# Patient Record
Sex: Female | Born: 1970 | Race: Black or African American | Hispanic: No | Smoking: Never smoker
Health system: Southern US, Community
[De-identification: ages and names within clinical notes are randomized; demographics above are authoritative.]

## PROBLEM LIST (undated history)

## (undated) DIAGNOSIS — I10 Essential (primary) hypertension: Secondary | ICD-10-CM

## (undated) DIAGNOSIS — G932 Benign intracranial hypertension: Secondary | ICD-10-CM

## (undated) DIAGNOSIS — E119 Type 2 diabetes mellitus without complications: Secondary | ICD-10-CM

## (undated) HISTORY — PX: JOINT REPLACEMENT: SHX530

## (undated) HISTORY — PX: REPLACEMENT TOTAL KNEE: SUR1224

## (undated) HISTORY — PX: ABDOMINAL HYSTERECTOMY: SHX81

---

## 2018-05-23 ENCOUNTER — Other Ambulatory Visit: Payer: Self-pay

## 2018-05-23 ENCOUNTER — Emergency Department
Admission: EM | Admit: 2018-05-23 | Discharge: 2018-05-23 | Disposition: A | Discharge status: Home / Self Care | Payer: BC Managed Care – PPO | Attending: Emergency Medicine | Admitting: Emergency Medicine

## 2018-05-23 ENCOUNTER — Emergency Department: Payer: BC Managed Care – PPO

## 2018-05-23 DIAGNOSIS — I10 Essential (primary) hypertension: Secondary | ICD-10-CM

## 2018-05-23 DIAGNOSIS — R519 Headache, unspecified: Secondary | ICD-10-CM

## 2018-05-23 DIAGNOSIS — R51 Headache: Secondary | ICD-10-CM | POA: Diagnosis present

## 2018-05-23 HISTORY — DX: Essential (primary) hypertension: I10

## 2018-05-23 HISTORY — DX: Benign intracranial hypertension: G93.2

## 2018-05-23 LAB — BASIC METABOLIC PANEL
Anion gap: 7 (ref 5–15)
BUN: 10 mg/dL (ref 6–20)
CALCIUM: 9 mg/dL (ref 8.9–10.3)
CHLORIDE: 104 mmol/L (ref 98–111)
CO2: 22 mmol/L (ref 22–32)
Creatinine, Ser: 0.86 mg/dL (ref 0.44–1.00)
GFR calc non Af Amer: >60 mL/min (ref >60)
Glucose, Bld: 264 mg/dL — ABNORMAL HIGH (ref 70–99)
Potassium: 3.6 mmol/L (ref 3.5–5.1)
Sodium: 133 mmol/L — ABNORMAL LOW (ref 135–145)

## 2018-05-23 LAB — CBC
HCT: 43.4 % (ref 35.0–47.0)
Hemoglobin: 15 g/dL (ref 12.0–16.0)
MCH: 28 pg (ref 26.0–34.0)
MCHC: 34.5 g/dL (ref 32.0–36.0)
MCV: 81.2 fL (ref 80.0–100.0)
PLATELETS: 275 10*3/uL (ref 150–440)
RBC: 5.35 MIL/uL — AB (ref 3.80–5.20)
RDW: 14.1 % (ref 11.5–14.5)
WBC: 7.6 10*3/uL (ref 3.6–11.0)

## 2018-05-23 LAB — TROPONIN I

## 2018-05-23 MED ORDER — KETOROLAC TROMETHAMINE 30 MG/ML IJ SOLN
30.0000 mg | Freq: Once | INTRAMUSCULAR | Status: AC
  Administered 2018-05-23: 30 mg via INTRAVENOUS
  Filled 2018-05-23: qty 1

## 2018-05-23 MED ORDER — PROCHLORPERAZINE EDISYLATE 10 MG/2ML IJ SOLN
10.0000 mg | Freq: Once | INTRAMUSCULAR | Status: AC
  Administered 2018-05-23: 10 mg via INTRAVENOUS
  Filled 2018-05-23: qty 2

## 2018-05-23 MED ORDER — METOPROLOL TARTRATE 5 MG/5ML IV SOLN
5.0000 mg | Freq: Once | INTRAVENOUS | Status: AC
Start: 2018-05-23 — End: 2018-05-23
  Administered 2018-05-23: 5 mg via INTRAVENOUS
  Filled 2018-05-23 (×2): qty 5

## 2018-05-23 NOTE — ED Triage Notes (Signed)
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 States usually goes to [facility] but couldn't make the drive. HA since Sunday. States was seeing black dots, blurred vision, and tingling in face. Denies it being in particular area just whole head. States normally has HA but this is worse. States hx of HTN, takes meds for it. States has been admitted for BP control before. Alert, oriented, ambulatory. Speaking in clear complete sentences.

## 2018-05-23 NOTE — ED Notes (Signed)
AAOx3.  Skin warm and dry.  MAE equally and strong.  NAD 

## 2018-05-23 NOTE — ED Provider Notes (Signed)
[facility] Emergency Department Provider Note    ____________________________________________   I have reviewed the triage vital signs and the nursing notes.   HISTORY  Chief Complaint Headache  History limited by: Not Limited   HPI Sharon Alvarez is a 47 y.o. female who presents to the emergency department today with primary complaint for headache.  It has been going on for the past couple of days.  Patient states she has a history of idiopathic intracranial hypertension.  She states she has been taking her medications as prescribed.  She does follow-up with a neurologist and states that she last saw him one month ago.  In addition she is noticed her blood pressures been elevated.  She does have a history of hypertension is on multiple hypertension medications.  Accompanied the headache with some nausea and vision changes.   Per medical record review patient has a history of hypertension, idiopathic depression hypertension.  Past Medical History:  Diagnosis Date  . Hypertension   . Idiopathic intracranial hypertension     There are no active problems to display for this patient.   Past Surgical History:  Procedure Laterality Date  . ABDOMINAL HYSTERECTOMY    . JOINT REPLACEMENT    . REPLACEMENT TOTAL KNEE      Prior to Admission medications   Not on File    Allergies Patient has no known allergies.  History reviewed. No pertinent family history.  Social History Social History   Tobacco Use  . Smoking status: Never Smoker  Substance Use Topics  . Alcohol use: Never    Frequency: Never  . Drug use: Not on file    Review of Systems Constitutional: No fever/chills Eyes: Positive for vision changes ENT: No sore throat. Cardiovascular: Denies chest pain. Respiratory: Denies shortness of breath. Gastrointestinal: No abdominal pain.  No nausea, no vomiting.  No diarrhea.   Genitourinary: Negative for  dysuria. Musculoskeletal: Negative for back pain. Skin: Negative for rash. Neurological: Positive for headaches ____________________________________________   PHYSICAL EXAM:  VITAL SIGNS: ED Triage Vitals  Enc Vitals Group     BP 05/23/18 1444 (!) 192/123     Pulse Rate 05/23/18 1444 (!) 105     Resp 05/23/18 1444 18     Temp 05/23/18 1444 98.6 F (37 C)     Temp Source 05/23/18 1444 Oral     SpO2 05/23/18 1444 98 %     Weight 05/23/18 1445 260 lb (117.9 kg)     Height 05/23/18 1445 5\' 6"  (1.676 m)     Head Circumference --      Peak Flow --      Pain Score 05/23/18 1444 10   Constitutional: Alert and oriented.  Eyes: Conjunctivae are normal.  Sharp disc margins on funduscopic exam ENT      Head: Normocephalic and atraumatic.      Nose: No congestion/rhinnorhea.      Mouth/Throat: Mucous membranes are moist.      Neck: No stridor. Hematological/Lymphatic/Immunilogical: No cervical lymphadenopathy. Cardiovascular: Normal rate, regular rhythm.  No murmurs, rubs, or gallops.  Respiratory: Normal respiratory effort without tachypnea nor retractions. Breath sounds are clear and equal bilaterally. No wheezes/rales/rhonchi. Gastrointestinal: Soft and non tender. No rebound. No guarding.  Genitourinary: Deferred Musculoskeletal: Normal range of motion in all extremities. No lower extremity edema. Neurologic:  Normal speech and language. No gross focal neurologic deficits are appreciated.  Skin:  Skin is warm, dry and intact. No rash noted. Psychiatric: Mood and affect  are normal. Speech and behavior are normal. Patient exhibits appropriate insight and judgment.  ____________________________________________    LABS (pertinent positives/negatives)  BMP na 133, k 3.6, glu 264, cr 0.86 CBC wbc 7.6, hgb 15.0, plt 275 Trop <0.03  ____________________________________________   EKG  None  ____________________________________________    RADIOLOGY  CT head No acute  findings  ____________________________________________   PROCEDURES  Procedures  ____________________________________________   INITIAL IMPRESSION / ASSESSMENT AND PLAN / ED COURSE  Pertinent labs & imaging results that were available during my care of the patient were reviewed by me and considered in my medical decision making (see chart for details).   Patient presented to the emergency department today because of concerns for headache and high blood pressure.  Patient does have a history of idiopathic cranial hypertension.  No papilledema on exam.  Did discuss with patient plan of treating medically at this time.  Patient was amenable to deferring a lumbar puncture.  Patient was given IV medications.  Shortly after she request discharge.  I did discuss the patient that I would like to see her blood pressure come down a little bit however she states she felt better and would be comfortable going home.  We did discuss importance of following up with neurology and that she should return if symptoms get worse.   ____________________________________________   FINAL CLINICAL IMPRESSION(S) / ED DIAGNOSES  Final diagnoses:  Bad headache  Hypertension, unspecified type     Note: This dictation was prepared with Dragon dictation. Any transcriptional errors that result from this process are unintentional     Phineas Semen, MD 05/23/18 1705

## 2018-05-23 NOTE — Discharge Instructions (Addendum)
Please seek medical attention for any high fevers, chest pain, shortness of breath, change in behavior, persistent vomiting, bloody stool or any other new or concerning symptoms.  

## 2019-01-08 ENCOUNTER — Emergency Department: Payer: BC Managed Care – PPO

## 2019-01-08 ENCOUNTER — Ambulatory Visit
Admission: EM | Admit: 2019-01-08 | Discharge: 2019-01-08 | Disposition: A | Discharge status: Home / Self Care | Payer: BC Managed Care – PPO | Source: Home / Self Care | Attending: Family Medicine | Admitting: Family Medicine

## 2019-01-08 ENCOUNTER — Emergency Department
Admission: EM | Admit: 2019-01-08 | Discharge: 2019-01-09 | Disposition: A | Discharge status: Home / Self Care | Payer: BC Managed Care – PPO | Attending: Emergency Medicine | Admitting: Emergency Medicine

## 2019-01-08 ENCOUNTER — Other Ambulatory Visit: Payer: Self-pay

## 2019-01-08 ENCOUNTER — Encounter: Payer: Self-pay | Admitting: Emergency Medicine

## 2019-01-08 ENCOUNTER — Encounter: Payer: Self-pay | Admitting: *Deleted

## 2019-01-08 DIAGNOSIS — R51 Headache: Secondary | ICD-10-CM | POA: Diagnosis not present

## 2019-01-08 DIAGNOSIS — I161 Hypertensive emergency: Secondary | ICD-10-CM | POA: Insufficient documentation

## 2019-01-08 DIAGNOSIS — R079 Chest pain, unspecified: Secondary | ICD-10-CM | POA: Diagnosis not present

## 2019-01-08 DIAGNOSIS — R0789 Other chest pain: Secondary | ICD-10-CM | POA: Diagnosis not present

## 2019-01-08 DIAGNOSIS — I10 Essential (primary) hypertension: Secondary | ICD-10-CM | POA: Insufficient documentation

## 2019-01-08 DIAGNOSIS — Z79899 Other long term (current) drug therapy: Secondary | ICD-10-CM | POA: Diagnosis not present

## 2019-01-08 DIAGNOSIS — R519 Headache, unspecified: Secondary | ICD-10-CM

## 2019-01-08 LAB — CBC
HEMATOCRIT: 48.3 % — AB (ref 36.0–46.0)
HEMOGLOBIN: 15.1 g/dL — AB (ref 12.0–15.0)
MCH: 26.1 pg (ref 26.0–34.0)
MCHC: 31.3 g/dL (ref 30.0–36.0)
MCV: 83.4 fL (ref 80.0–100.0)
NRBC: 0 % (ref 0.0–0.2)
PLATELETS: 243 10*3/uL (ref 150–400)
RBC: 5.79 MIL/uL — AB (ref 3.87–5.11)
RDW: 13.7 % (ref 11.5–15.5)
WBC: 6.1 10*3/uL (ref 4.0–10.5)

## 2019-01-08 LAB — BASIC METABOLIC PANEL
Anion gap: 10 (ref 5–15)
BUN: 10 mg/dL (ref 6–20)
CO2: 24 mmol/L (ref 22–32)
CREATININE: 0.65 mg/dL (ref 0.44–1.00)
Calcium: 9.1 mg/dL (ref 8.9–10.3)
Chloride: 102 mmol/L (ref 98–111)
GFR calc Af Amer: >60 mL/min (ref >60)
GFR calc non Af Amer: >60 mL/min (ref >60)
Glucose, Bld: 251 mg/dL — ABNORMAL HIGH (ref 70–99)
Potassium: 3.6 mmol/L (ref 3.5–5.1)
SODIUM: 136 mmol/L (ref 135–145)

## 2019-01-08 LAB — TROPONIN I: Troponin I: <0.03 ng/mL (ref <0.03)

## 2019-01-08 MED ORDER — SODIUM CHLORIDE 0.9% FLUSH: 3.0000 mL | Freq: Once | INTRAVENOUS | Status: DC

## 2019-01-08 MED ORDER — LABETALOL HCL 5 MG/ML IV SOLN
5.0000 mg | Freq: Once | INTRAVENOUS | Status: AC
  Administered 2019-01-09: 5 mg via INTRAVENOUS
  Filled 2019-01-08: qty 4

## 2019-01-08 NOTE — ED Triage Notes (Signed)
Pt brought in via EMS from [facility] urgent care.  Pt here for eval of headache and hypertension.  Pt took bp meds today.   Pt alert and speech clear.

## 2019-01-08 NOTE — ED Notes (Signed)
PT states that she has a migraine that she has had for about a week. Pt was sent here from urgent care due to BP being extremely high.

## 2019-01-08 NOTE — ED Notes (Signed)
No repeat ekg at this time per dr Lenard Lance.

## 2019-01-08 NOTE — ED Provider Notes (Signed)
[facility]    CSN: [id number] Arrival date & time: 01/08/19  1816  History   Chief Complaint Chief Complaint  Patient presents with  . Shortness of Breath   HPI  48 year old female presents with shortness of breath.  Patient has longstanding uncontrolled hypertension.  Patient reports that for the past 2 days she has had shortness of breath.  She states that it is constant.  She also reports nausea, headaches, dizziness.  She has a history of pseudotumor.  Patient states that she feels fatigued as well.  Symptoms are severe.  Patient also is compliant with her home medication.  Per the patient, she has had intolerances to other blood pressure medications.  She mentions lisinopril specifically.  No other associated symptoms. No other complaints.   PMH, Surgical Hx, Family Hx, Social History reviewed and updated as below.  Past Medical History:  Diagnosis Date  . Hypertension   . Idiopathic intracranial hypertension    Past Surgical History:  Procedure Laterality Date  . ABDOMINAL HYSTERECTOMY    . JOINT REPLACEMENT    . REPLACEMENT TOTAL KNEE     OB History   No obstetric history on file.    Home Medications    Prior to Admission medications   Medication Sig Start Date End Date Taking? Authorizing Provider  acetaZOLAMIDE (DIAMOX) 500 MG capsule Take by mouth.   Yes [provider]  atenolol (TENORMIN) 25 MG tablet Take by mouth.   Yes [provider]  spironolactone (ALDACTONE) 100 MG tablet Take by mouth. 05/27/16  Yes [provider]    Family History Family History  Problem Relation Age of Onset  . Healthy Mother   . Healthy Father     Social History Social History   Tobacco Use  . Smoking status: Never Smoker  . Smokeless tobacco: Never Used  Substance Use Topics  . Alcohol use: Never    Frequency: Never  . Drug use: Never     Allergies   Patient has no known allergies.   Review of Systems Review of Systems   Constitutional: Positive for fatigue.  Respiratory: Positive for shortness of breath.   Gastrointestinal: Positive for nausea.  Neurological: Positive for dizziness and headaches.   Physical Exam Triage Vital Signs ED Triage Vitals  Enc Vitals Group     BP 01/08/19 1829 (S) (!) 180/106     Pulse Rate 01/08/19 1829 87     Resp 01/08/19 1829 16     Temp 01/08/19 1829 98 F (36.7 C)     Temp Source 01/08/19 1829 Oral     SpO2 01/08/19 1829 100 %     Weight 01/08/19 1825 250 lb (113.4 kg)     Height 01/08/19 1825 5\' 6"  (1.676 m)     Head Circumference --      Peak Flow --      Pain Score 01/08/19 1825 8     Pain Loc --      Pain Edu? --      Excl. in GC? --    Updated Vital Signs BP (S) (!) 180/106 (BP Location: Right Arm)   Pulse 87   Temp 98 F (36.7 C) (Oral)   Resp 16   Ht 5\' 6"  (1.676 m)   Wt 113.4 kg   SpO2 100%   BMI 40.35 kg/m   Visual Acuity Right Eye Distance:   Left Eye Distance:   Bilateral Distance:    Right Eye Near:   Left Eye  Near:    Bilateral Near:     Physical Exam Vitals signs and nursing note reviewed.  Constitutional:      General: She is not in acute distress.    Appearance: She is well-developed. She is obese.  HENT:     Head: Normocephalic and atraumatic.     Nose: Nose normal.  Eyes:     General:        Right eye: No discharge.        Left eye: No discharge.     Conjunctiva/sclera: Conjunctivae normal.  Cardiovascular:     Rate and Rhythm: Normal rate and regular rhythm.  Pulmonary:     Effort: Pulmonary effort is normal.     Breath sounds: Normal breath sounds.  Neurological:     Mental Status: She is alert.  Psychiatric:        Mood and Affect: Mood normal.        Behavior: Behavior normal.    UC Treatments / Results  Labs (all labs ordered are listed, but only abnormal results are displayed) Labs Reviewed - No data to display  EKG Interpretation: Normal sinus rhythm with rate of 78.  LVH noted.  No ST or T wave  changes.  Probable LAE.  Radiology No results found.  Procedures Procedures (including critical care time)  Medications Ordered in UC Medications - No data to display  Initial Impression / Assessment and Plan / UC Course  I have reviewed the triage vital signs and the nursing notes.  Pertinent labs & imaging results that were available during my care of the patient were reviewed by me and considered in my medical decision making (see chart for details).    48 year old female presents with elevated BP and shortness of breath.  This is consistent with hypertensive emergency.  Patient has a longstanding history of uncontrolled hypertension.  Patient needs further evaluation and monitoring and potential admission.  Sending via EMS.  Patient desired to go via EMS.  Final Clinical Impressions(s) / UC Diagnoses   Final diagnoses:  Hypertensive emergency   Discharge Instructions   None    ED Prescriptions    None     Controlled Substance Prescriptions [place] Controlled Substance Registry consulted? Not Applicable   Tommie Sams, DO 01/08/19 1911

## 2019-01-08 NOTE — ED Triage Notes (Signed)
Patient c/o SOB, HAs and chest pain that started 2 days ago.

## 2019-01-08 NOTE — ED Notes (Signed)
EMS called to transport patient to [facility] ED 

## 2019-01-09 MED ORDER — ATENOLOL 50 MG PO TABS: 50.0000 mg | ORAL_TABLET | Freq: Every day | ORAL | 11 refills | Status: AC

## 2019-01-09 MED ORDER — IPRATROPIUM-ALBUTEROL 0.5-2.5 (3) MG/3ML IN SOLN
3.0000 mL | Freq: Once | RESPIRATORY_TRACT | Status: AC
  Administered 2019-01-09: 3 mL via RESPIRATORY_TRACT
  Filled 2019-01-09: qty 3

## 2019-01-09 MED ORDER — DIPHENHYDRAMINE HCL 50 MG/ML IJ SOLN
12.5000 mg | Freq: Once | INTRAMUSCULAR | Status: AC
  Administered 2019-01-09: 12.5 mg via INTRAVENOUS
  Filled 2019-01-09: qty 1

## 2019-01-09 MED ORDER — ATENOLOL 25 MG PO TABS
25.0000 mg | ORAL_TABLET | Freq: Once | ORAL | Status: AC
  Administered 2019-01-09: 25 mg via ORAL
  Filled 2019-01-09: qty 1

## 2019-01-09 MED ORDER — PROCHLORPERAZINE EDISYLATE 10 MG/2ML IJ SOLN
10.0000 mg | Freq: Once | INTRAMUSCULAR | Status: AC
  Administered 2019-01-09: 10 mg via INTRAVENOUS
  Filled 2019-01-09: qty 2

## 2019-01-09 NOTE — Discharge Instructions (Addendum)
Please increase your atenolol to 50 mg once a day.  For now we will take 2 of your 25 mg pills together.  Please call your doctor and get a follow-up appointment this afternoon or tomorrow.  Please also call the cardiologist and get a follow-up appointment with them.  You can see ours here or your doctor can refer you to [address] your house.  Please return here for worsening headache fever vomiting or feeling sicker.  Your doctor may want to give you an additional antihypertensive medication.

## 2019-01-09 NOTE — ED Notes (Signed)
Patient states headache is gone, but still feels short of breath. When asked further about shortness of breath, patient verbalizes that it feels like it is difficult to expand lungs all the way. Patient appears to be resting comfortably throughout conversation and has not had any hypoxic episodes. Patient has been up to restroom and hooked back up to O2 probe immediately after without any drop in O2 sats prior to breathing treatment this evening. Dr. Darnelle Catalan aware of patient's continued complaint. Patient in no acute distress at this time.

## 2019-01-09 NOTE — ED Notes (Signed)
Patient to be held for monitoring for 30 mins per Dr. Darnelle Catalan.

## 2019-01-09 NOTE — ED Provider Notes (Addendum)
[facility] Emergency Department Provider Note   ____________________________________________   First MD Initiated Contact with Patient 01/08/19 2305     (approximate)  I have reviewed the triage vital signs and the nursing notes.   HISTORY  Chief Complaint Migraine and Hypertension  HPI Sharon Alvarez is a 48 y.o. female patient sent here from urgent care.  Patient reports over a week of headache which is severe and diffuse.  She cannot tell if it is her migraine headache or her pseudotumor cerebra headache.  She also has had 3 days of chest tightness which is constant.  It does not get better or worse.  She feels somewhat short of breath.  There is no pain with deep breathing however.  She is not coughing or running a fever.  She cannot tell me anything that makes the chest tightness worse or the headache worse.  Patient reports the only medicine she is currently on are the medicines listed here:  Diamox, atenolol and Spironolactone.     Past Medical History:  Diagnosis Date  . Hypertension   . Idiopathic intracranial hypertension     There are no active problems to display for this patient.   Past Surgical History:  Procedure Laterality Date  . ABDOMINAL HYSTERECTOMY    . JOINT REPLACEMENT    . REPLACEMENT TOTAL KNEE      Prior to Admission medications   Medication Sig Start Date End Date Taking? Authorizing Provider  acetaZOLAMIDE (DIAMOX) 500 MG capsule Take by mouth.    [provider]  atenolol (TENORMIN) 25 MG tablet Take by mouth.    [provider]  spironolactone (ALDACTONE) 100 MG tablet Take by mouth. 05/27/16   [provider]    Allergies Patient has no known allergies.  Family History  Problem Relation Age of Onset  . Healthy Mother   . Healthy Father     Social History Social History   Tobacco Use  . Smoking status: Never Smoker  . Smokeless tobacco: Never Used  Substance Use  Topics  . Alcohol use: Never    Frequency: Never  . Drug use: Never    Review of Systems  Constitutional: No fever/chills Eyes: No visual changes. ENT: No sore throat. Cardiovascular: See HPI. Respiratory: See HPI h. Gastrointestinal: No abdominal pain.  No nausea, no vomiting.  No diarrhea.  No constipation. Genitourinary: Negative for dysuria. Musculoskeletal: Negative for back pain. Skin: Negative for rash. Neurological: Negative for, focal weakness .   ____________________________________________   PHYSICAL EXAM:  VITAL SIGNS: ED Triage Vitals  Enc Vitals Group     BP 01/08/19 1957 (!) 201/102     Pulse Rate 01/08/19 1957 79     Resp 01/08/19 1957 20     Temp 01/08/19 1957 98.4 F (36.9 C)     Temp Source 01/08/19 1957 Oral     SpO2 01/08/19 1957 100 %     Weight 01/08/19 1959 250 lb (113.4 kg)     Height 01/08/19 1959 5\' 6"  (1.676 m)     Head Circumference --      Peak Flow --      Pain Score 01/08/19 1959 10     Pain Loc --      Pain Edu? --      Excl. in GC? --     Constitutional: Alert and oriented. Well appearing and in no acute distress. Eyes: Conjunctivae are normal. PERRL. EOMI. fundi are somewhat difficult to see but I  do not see any papilledema Head: Atraumatic. Nose: No congestion/rhinnorhea. Mouth/Throat: Mucous membranes are moist.  Oropharynx non-erythematous. Neck: No stridor. Cardiovascular: Normal rate, regular rhythm. Grossly normal heart sounds.  Good peripheral circulation. Respiratory: Normal respiratory effort.  No retractions. Lungs CTAB. Gastrointestinal: Soft and nontender. No distention. No abdominal bruits. No CVA tenderness. Musculoskeletal: No lower extremity tenderness nor edema.  Neurologic:  Normal speech and language. No gross focal neurologic deficits are appreciated.  Skin no rashes  ____________________________________________   LABS (all labs ordered are listed, but only abnormal results are displayed)  Labs  Reviewed  BASIC METABOLIC PANEL - Abnormal; Notable for the following components:      Result Value   Glucose, Bld 251 (*)    All other components within normal limits  CBC - Abnormal; Notable for the following components:   RBC 5.79 (*)    Hemoglobin 15.1 (*)    HCT 48.3 (*)    All other components within normal limits  TROPONIN I   ____________________________________________  EKG EKG read interpreted by me shows normal sinus rhythm rate of 78 left axis no acute ST-T wave changes.  There are no old EKGs I can compare with.  ____________________________________________  RADIOLOGY  ED MD interpretation: Chest x-ray read by radiology reviewed by me shows no acute disease head CT read by radiology shows no acute changes.  Official radiology report(s): Dg Chest 2 View  Result Date: 01/08/2019 CLINICAL DATA:  Acute shortness of breath and chest pain. EXAM: CHEST - 2 VIEW COMPARISON:  None. FINDINGS: The cardiomediastinal silhouette is unremarkable. There is no evidence of focal airspace disease, pulmonary edema, suspicious pulmonary nodule/mass, pleural effusion, or pneumothorax. No acute bony abnormalities are identified. IMPRESSION: No active cardiopulmonary disease. Electronically Signed   By: Harmon Pier M.D.   On: 01/08/2019 20:34   Ct Head Wo Contrast  Result Date: 01/09/2019 CLINICAL DATA:  Migraine from about a week. High blood pressure. EXAM: CT HEAD WITHOUT CONTRAST TECHNIQUE: Contiguous axial images were obtained from the base of the skull through the vertex without intravenous contrast. COMPARISON:  05/23/2018 FINDINGS: Brain: No evidence of acute infarction, hemorrhage, hydrocephalus, extra-axial collection or mass lesion/mass effect. Vascular: No hyperdense vessel or unexpected calcification. Skull: Calvarium appears intact. Sinuses/Orbits: Paranasal sinuses and mastoid air cells are clear. Other: No significant changes since previous study. IMPRESSION: No acute intracranial  abnormalities. Electronically Signed   By: Burman Nieves M.D.   On: 01/09/2019 00:15    ____________________________________________   PROCEDURES  Procedure(s) performed (including Critical Care):  Procedures   ____________________________________________   INITIAL IMPRESSION / ASSESSMENT AND PLAN / ED COURSE  When I see her at 310 she reports her headache is gone her chest tightness is better and she feels better although she is sleepy now.  I discussed with her the fact that I will increase her atenolol to 50 mg once a day although atenolol is not the best thing for hypertension it is good for migraines and several other indications.  I asked her to follow-up with her regular doctor.  Later this afternoon or tomorrow.  I will also have her follow-up with cardiology just to be sure even know everything is done here is normal.  She denied any chest pain with deep breathing.  She did not have any increased headache with grunting or bending over coughing.  I discussed with her the fact she might need another antihypertensive.  ____________________________________________   FINAL CLINICAL IMPRESSION(S) / ED DIAGNOSES  Final diagnoses:  Hypertension, unspecified type  Bad headache  Chest tightness     ED Discharge Orders    None       Note:  This document was prepared using Dragon voice recognition software and may include unintentional dictation errors.    Arnaldo Natal, MD 01/09/19 7902    Arnaldo Natal, MD 01/09/19 [phone]

## 2019-06-21 ENCOUNTER — Other Ambulatory Visit: Payer: Self-pay

## 2019-06-21 ENCOUNTER — Emergency Department
Admission: EM | Admit: 2019-06-21 | Discharge: 2019-06-22 | Disposition: A | Discharge status: Home / Self Care | Payer: BC Managed Care – PPO | Attending: Emergency Medicine | Admitting: Emergency Medicine

## 2019-06-21 ENCOUNTER — Emergency Department: Payer: BC Managed Care – PPO

## 2019-06-21 DIAGNOSIS — E1165 Type 2 diabetes mellitus with hyperglycemia: Secondary | ICD-10-CM | POA: Insufficient documentation

## 2019-06-21 DIAGNOSIS — N200 Calculus of kidney: Secondary | ICD-10-CM

## 2019-06-21 DIAGNOSIS — I10 Essential (primary) hypertension: Secondary | ICD-10-CM | POA: Insufficient documentation

## 2019-06-21 DIAGNOSIS — N83202 Unspecified ovarian cyst, left side: Secondary | ICD-10-CM | POA: Diagnosis not present

## 2019-06-21 DIAGNOSIS — R1030 Lower abdominal pain, unspecified: Secondary | ICD-10-CM | POA: Diagnosis present

## 2019-06-21 DIAGNOSIS — Z79899 Other long term (current) drug therapy: Secondary | ICD-10-CM | POA: Diagnosis not present

## 2019-06-21 DIAGNOSIS — Z96659 Presence of unspecified artificial knee joint: Secondary | ICD-10-CM | POA: Diagnosis not present

## 2019-06-21 LAB — URINALYSIS, COMPLETE (UACMP) WITH MICROSCOPIC
Bacteria, UA: NONE SEEN
Bilirubin Urine: NEGATIVE
Glucose, UA: >=500 mg/dL — AB
Hgb urine dipstick: NEGATIVE
Ketones, ur: NEGATIVE mg/dL
Leukocytes,Ua: NEGATIVE
Nitrite: NEGATIVE
Protein, ur: NEGATIVE mg/dL
Specific Gravity, Urine: 1.024 (ref 1.005–1.030)
pH: 6 (ref 5.0–8.0)

## 2019-06-21 LAB — CBC
HCT: 49 % — ABNORMAL HIGH (ref 36.0–46.0)
Hemoglobin: 15 g/dL (ref 12.0–15.0)
MCH: 26.9 pg (ref 26.0–34.0)
MCHC: 30.6 g/dL (ref 30.0–36.0)
MCV: 88 fL (ref 80.0–100.0)
Platelets: 271 10*3/uL (ref 150–400)
RBC: 5.57 MIL/uL — ABNORMAL HIGH (ref 3.87–5.11)
RDW: 13.6 % (ref 11.5–15.5)
WBC: 7.3 10*3/uL (ref 4.0–10.5)
nRBC: 0 % (ref 0.0–0.2)

## 2019-06-21 LAB — COMPREHENSIVE METABOLIC PANEL
ALT: 19 U/L (ref 0–44)
AST: 21 U/L (ref 15–41)
Albumin: 4 g/dL (ref 3.5–5.0)
Alkaline Phosphatase: 110 U/L (ref 38–126)
Anion gap: 9 (ref 5–15)
BUN: 9 mg/dL (ref 6–20)
CO2: 23 mmol/L (ref 22–32)
Calcium: 9.6 mg/dL (ref 8.9–10.3)
Chloride: 105 mmol/L (ref 98–111)
Creatinine, Ser: 0.62 mg/dL (ref 0.44–1.00)
GFR calc Af Amer: >60 mL/min (ref >60)
GFR calc non Af Amer: >60 mL/min (ref >60)
Glucose, Bld: 311 mg/dL — ABNORMAL HIGH (ref 70–99)
Potassium: 3.8 mmol/L (ref 3.5–5.1)
Sodium: 137 mmol/L (ref 135–145)
Total Bilirubin: 0.4 mg/dL (ref 0.3–1.2)
Total Protein: 7.6 g/dL (ref 6.5–8.1)

## 2019-06-21 LAB — GLUCOSE, CAPILLARY: Glucose-Capillary: 285 mg/dL — ABNORMAL HIGH (ref 70–99)

## 2019-06-21 LAB — LIPASE, BLOOD: Lipase: 43 U/L (ref 11–51)

## 2019-06-21 MED ORDER — IOHEXOL 300 MG/ML  SOLN
100.0000 mL | Freq: Once | INTRAMUSCULAR | Status: AC | PRN
  Administered 2019-06-21: 100 mL via INTRAVENOUS

## 2019-06-21 MED ORDER — MORPHINE SULFATE (PF) 4 MG/ML IV SOLN
4.0000 mg | Freq: Once | INTRAVENOUS | Status: AC
  Administered 2019-06-21: 4 mg via INTRAVENOUS
  Filled 2019-06-21: qty 1

## 2019-06-21 MED ORDER — OXYCODONE-ACETAMINOPHEN 5-325 MG PO TABS
1.0000 | ORAL_TABLET | Freq: Once | ORAL | Status: AC
  Administered 2019-06-21: 1 via ORAL
  Filled 2019-06-21: qty 1

## 2019-06-21 MED ORDER — SODIUM CHLORIDE 0.9 % IV BOLUS
1000.0000 mL | Freq: Once | INTRAVENOUS | Status: AC
  Administered 2019-06-21: 1000 mL via INTRAVENOUS

## 2019-06-21 MED ORDER — ONDANSETRON HCL 4 MG/2ML IJ SOLN
4.0000 mg | Freq: Once | INTRAMUSCULAR | Status: AC
  Administered 2019-06-21: 4 mg via INTRAVENOUS
  Filled 2019-06-21: qty 2

## 2019-06-21 NOTE — ED Provider Notes (Signed)
[facility] Emergency Department Provider Note  ____________________________________________   First MD Initiated Contact with Patient 06/21/19 2256     (approximate)  I have reviewed the triage vital signs and the nursing notes.   HISTORY  Chief Complaint Abdominal Pain    HPI Sharon Alvarez is a 48 y.o. female with below list of previous medical conditions include hypertension idiopathic intracranial hypertension and "prediabetes" presents to the emergency department secondary to lower abdominal discomfort which patient states is currently 10 out of 10 with associated nausea which began earlier today.  Patient denies any fever.  Patient denies any diarrhea constipation.  Patient denies any urinary symptoms.       Past Medical History:  Diagnosis Date  . Hypertension   . Idiopathic intracranial hypertension     There are no active problems to display for this patient.   Past Surgical History:  Procedure Laterality Date  . ABDOMINAL HYSTERECTOMY    . JOINT REPLACEMENT    . REPLACEMENT TOTAL KNEE      Prior to Admission medications   Medication Sig Start Date End Date Taking? Authorizing Provider  acetaZOLAMIDE (DIAMOX) 500 MG capsule Take by mouth.    [provider]  atenolol (TENORMIN) 25 MG tablet Take by mouth.    [provider]  atenolol (TENORMIN) 50 MG tablet Take 1 tablet (50 mg total) by mouth daily. 01/09/19 01/09/20  Nena Polio, MD  spironolactone (ALDACTONE) 100 MG tablet Take by mouth. 05/27/16   [provider]    Allergies Patient has no known allergies.  Family History  Problem Relation Age of Onset  . Healthy Mother   . Healthy Father     Social History Social History   Tobacco Use  . Smoking status: Never Smoker  . Smokeless tobacco: Never Used  Substance Use Topics  . Alcohol use: Never    Frequency: Never  . Drug use: Never    Review of Systems Constitutional: No  fever/chills Eyes: No visual changes. ENT: No sore throat. Cardiovascular: Denies chest pain. Respiratory: Denies shortness of breath. Gastrointestinal: No abdominal pain.  No nausea, no vomiting.  No diarrhea.  No constipation. Genitourinary: Negative for dysuria. Musculoskeletal: Negative for neck pain.  Negative for back pain. Integumentary: Negative for rash. Neurological: Negative for headaches, focal weakness or numbness.   ____________________________________________   PHYSICAL EXAM:  VITAL SIGNS: ED Triage Vitals  Enc Vitals Group     BP 06/21/19 2023 (!) 178/88     Pulse Rate 06/21/19 2023 73     Resp 06/21/19 2023 18     Temp 06/21/19 2023 98.7 F (37.1 C)     Temp src --      SpO2 06/21/19 2023 99 %     Weight 06/21/19 2021 113.4 kg (250 lb)     Height 06/21/19 2021 1.778 m (5\' 10" )     Head Circumference --      Peak Flow --      Pain Score 06/21/19 2020 10     Pain Loc --      Pain Edu? --      Excl. in [place]? --     Constitutional: Alert and oriented.  Eyes: Conjunctivae are normal.  Mouth/Throat: Mucous membranes are moist. Neck: No stridor.  No meningeal signs.   Cardiovascular: Normal rate, regular rhythm. Good peripheral circulation. Grossly normal heart sounds. Respiratory: Normal respiratory effort.  No retractions. Gastrointestinal: Soft and nontender. No distention.  Musculoskeletal: No lower  extremity tenderness nor edema. No gross deformities of extremities. Neurologic:  Normal speech and language. No gross focal neurologic deficits are appreciated.  Skin:  Skin is warm, dry and intact. Psychiatric: Mood and affect are normal. Speech and behavior are normal.  ____________________________________________   LABS (all labs ordered are listed, but only abnormal results are displayed)  Labs Reviewed  COMPREHENSIVE METABOLIC PANEL - Abnormal; Notable for the following components:      Result Value   Glucose, Bld 311 (*)    All other components  within normal limits  CBC - Abnormal; Notable for the following components:   RBC 5.57 (*)    HCT 49.0 (*)    All other components within normal limits  URINALYSIS, COMPLETE (UACMP) WITH MICROSCOPIC - Abnormal; Notable for the following components:   Color, Urine STRAW (*)    APPearance CLEAR (*)    Glucose, UA >=500 (*)    All other components within normal limits  GLUCOSE, CAPILLARY - Abnormal; Notable for the following components:   Glucose-Capillary 285 (*)    All other components within normal limits  LIPASE, BLOOD   ____________________________________________   Procedures   ____________________________________________   INITIAL IMPRESSION / MDM / ASSESSMENT AND PLAN / ED COURSE  As part of my medical decision making, I reviewed the following data within the electronic MEDICAL RECORD NUMBER   48 year old female present with above-stated history and physical exam secondary to abdominal pain and nausea.  Concern for possible ureterolithiasis diverticulitis less likely appendicitis and ovarian cyst.  CT scan revealed evidence of left kidney stone and left ovarian cyst.  Patient given below stated IV narcotic medications in the emergency department improvement of pain.  In addition patient noted to be hyperglycemic with a glucose of 311 for which the patient was given metformin.  Patient be prescribed metformin for home as well as Percocet for pain with referral to urology and gynecology.    ____________________________________________  FINAL CLINICAL IMPRESSION(S) / ED DIAGNOSES  Final diagnoses:  Cyst of left ovary  Kidney stone on left side  Type 2 diabetes mellitus with hyperglycemia, without long-term current use of insulin (HCC)     MEDICATIONS GIVEN DURING THIS VISIT:  Medications  morphine 4 MG/ML injection 4 mg (has no administration in time range)  ondansetron (ZOFRAN) injection 4 mg (has no administration in time range)  oxyCODONE-acetaminophen  (PERCOCET/ROXICET) 5-325 MG per tablet 1 tablet (1 tablet Oral Given 06/21/19 2251)     ED Discharge Orders    None      *Please note:  Sandra CockayneMichelle Ellis Fitzgerald was evaluated in Emergency Department on 06/21/2019 for the symptoms described in the history of present illness. She was evaluated in the context of the global COVID-19 pandemic, which necessitated consideration that the patient might be at risk for infection with the SARS-CoV-2 virus that causes COVID-19. Institutional protocols and algorithms that pertain to the evaluation of patients at risk for COVID-19 are in a state of rapid change based on information released by regulatory bodies including the CDC and federal and state organizations. These policies and algorithms were followed during the patient's care in the ED.  Some ED evaluations and interventions may be delayed as a result of limited staffing during the pandemic.*  Note:  This document was prepared using Dragon voice recognition software and may include unintentional dictation errors.   Darci CurrentBrown, [place], MD 06/22/19 [phone]

## 2019-06-21 NOTE — ED Triage Notes (Signed)
Patient c/o lower abdominal, nausea beginning earlier today.

## 2019-06-21 NOTE — ED Notes (Signed)
Patient transported to CT 

## 2019-06-22 MED ORDER — METFORMIN HCL 500 MG PO TABS: 500.0000 mg | ORAL_TABLET | Freq: Two times a day (BID) | ORAL | 3 refills | Status: AC

## 2019-06-22 MED ORDER — METFORMIN HCL 500 MG PO TABS
500.0000 mg | ORAL_TABLET | Freq: Once | ORAL | Status: AC
  Administered 2019-06-22: 500 mg via ORAL
  Filled 2019-06-22: qty 1

## 2019-06-22 MED ORDER — OXYCODONE-ACETAMINOPHEN 5-325 MG PO TABS: 1.0000 | ORAL_TABLET | ORAL | 0 refills | Status: DC | PRN

## 2019-06-22 MED ORDER — HYDROMORPHONE HCL 1 MG/ML IJ SOLN
1.0000 mg | Freq: Once | INTRAMUSCULAR | Status: AC
  Administered 2019-06-22: 1 mg via INTRAVENOUS
  Filled 2019-06-22: qty 1

## 2019-11-04 ENCOUNTER — Other Ambulatory Visit: Payer: Self-pay

## 2019-11-04 ENCOUNTER — Ambulatory Visit
Admission: EM | Admit: 2019-11-04 | Discharge: 2019-11-04 | Disposition: A | Discharge status: Home / Self Care | Payer: BC Managed Care – PPO | Attending: Nurse Practitioner | Admitting: Nurse Practitioner

## 2019-11-04 DIAGNOSIS — R5383 Other fatigue: Secondary | ICD-10-CM | POA: Diagnosis not present

## 2019-11-04 DIAGNOSIS — R519 Headache, unspecified: Secondary | ICD-10-CM | POA: Diagnosis not present

## 2019-11-04 DIAGNOSIS — I10 Essential (primary) hypertension: Secondary | ICD-10-CM | POA: Insufficient documentation

## 2019-11-04 DIAGNOSIS — Z20822 Contact with and (suspected) exposure to covid-19: Secondary | ICD-10-CM | POA: Diagnosis not present

## 2019-11-04 DIAGNOSIS — Z7984 Long term (current) use of oral hypoglycemic drugs: Secondary | ICD-10-CM | POA: Diagnosis not present

## 2019-11-04 DIAGNOSIS — Z79899 Other long term (current) drug therapy: Secondary | ICD-10-CM | POA: Insufficient documentation

## 2019-11-04 NOTE — Discharge Instructions (Signed)
You may take tylenol or ibuprofen as needed for fevers/headache/body aches. Drink plenty of fluids. Stay in home isolation until you receive results of your COVID test. You will only be notified for positive results. You may go online to MyChart in the next few days and review your results. Please follow CDC guidelines that are attached. You may discontinue home isolation when there has been at least 10 days since symptoms onset AND 3 days fever free without antipyretics (Tylenol or Ibuprofen) AND an overall improvement in your symptoms. Go to the ED immediately if you get worse or have any other symptoms.   Feel better soon!  Alexsandro Salek, FNP-C   

## 2019-11-04 NOTE — ED Triage Notes (Signed)
Pt. States her last contact with her POSITIVE cousin on 10/29/2019. Pt. Denies ANY symptoms of COVID.

## 2019-11-04 NOTE — ED Provider Notes (Signed)
[facility]    CSN: [id number] Arrival date & time: 11/04/19  1132      History   Chief Complaint Chief Complaint  Patient presents with  . COVID exposure    HPI Sharon Alvarez is a 49 y.o. female.   Subjective:   Sharon Alvarez is a 49 y.o. female who presents for evaluation after exposure to COVID-19.  The patient was with her cousin on 10/29/2019.  On that same day, her cousin started to experience COVID-19-like symptoms.  She was tested that day and received her positive results on today.  Patient reports a 2-day history of headache and fatigue.  She denies any fevers, chills, body aches, sore throat, cough, shortness of breath, nausea, vomiting, dizziness or change in taste/smell.  She has not tried anything for her symptoms.  She does not have any high risk factors for COVID-19 complications.   The following portions of the patient's history were reviewed and updated as appropriate: allergies, current medications, past family history, past medical history, past social history, past surgical history and problem list.       Past Medical History:  Diagnosis Date  . Hypertension   . Idiopathic intracranial hypertension     There are no problems to display for this patient.   Past Surgical History:  Procedure Laterality Date  . ABDOMINAL HYSTERECTOMY    . JOINT REPLACEMENT    . REPLACEMENT TOTAL KNEE      OB History   No obstetric history on file.      Home Medications    Prior to Admission medications   Medication Sig Start Date End Date Taking? Authorizing Provider  acetaZOLAMIDE (DIAMOX) 500 MG capsule Take by mouth.    [provider]  atenolol (TENORMIN) 25 MG tablet Take by mouth.    [provider]  atenolol (TENORMIN) 50 MG tablet Take 1 tablet (50 mg total) by mouth daily. 01/09/19 01/09/20  Arnaldo Natal, MD  metFORMIN (GLUCOPHAGE) 500 MG tablet Take 1 tablet (500 mg total) by mouth 2 (two) times  daily with a meal. 06/22/19 10/20/19  Darci Current, MD  oxyCODONE-acetaminophen (PERCOCET) 5-325 MG tablet Take 1 tablet by mouth every 4 (four) hours as needed. 06/22/19 06/21/20  Darci Current, MD  spironolactone (ALDACTONE) 100 MG tablet Take by mouth. 05/27/16   [provider]    Family History Family History  Problem Relation Age of Onset  . Healthy Mother   . Healthy Father     Social History Social History   Tobacco Use  . Smoking status: Never Smoker  . Smokeless tobacco: Never Used  Substance Use Topics  . Alcohol use: Never  . Drug use: Never     Allergies   Patient has no known allergies.   Review of Systems Review of Systems  Constitutional: Positive for fatigue.  HENT: Negative.   Respiratory: Negative.   Cardiovascular: Negative.   Gastrointestinal: Negative.   Musculoskeletal: Negative.   Skin: Negative.   Neurological: Positive for headaches.  All other systems reviewed and are negative.    Physical Exam Triage Vital Signs ED Triage Vitals  Enc Vitals Group     BP 11/04/19 1144 (!) 150/91     Pulse Rate 11/04/19 1144 74     Resp 11/04/19 1144 19     Temp 11/04/19 1144 98.4 F (36.9 C)     Temp Source 11/04/19 1144 Oral     SpO2 11/04/19 1144 100 %  Weight 11/04/19 1141 248 lb (112.5 kg)     Height --      Head Circumference --      Peak Flow --      Pain Score 11/04/19 1140 0     Pain Loc --      Pain Edu? --      Excl. in [place]? --    No data found.  Updated Vital Signs BP (!) 150/91 (BP Location: Right Arm)   Pulse 74   Temp 98.4 F (36.9 C) (Oral)   Resp 19   Wt 248 lb (112.5 kg)   SpO2 100%   BMI 35.58 kg/m   Visual Acuity Right Eye Distance:   Left Eye Distance:   Bilateral Distance:    Right Eye Near:   Left Eye Near:    Bilateral Near:     Physical Exam Vitals reviewed.  Constitutional:      General: She is not in acute distress.    Appearance: Normal appearance. She is not ill-appearing,  toxic-appearing or diaphoretic.  HENT:     Head: Normocephalic.  Cardiovascular:     Rate and Rhythm: Normal rate and regular rhythm.  Pulmonary:     Effort: Pulmonary effort is normal.  Musculoskeletal:        General: Normal range of motion.     Cervical back: Normal range of motion and neck supple.  Skin:    General: Skin is warm and dry.  Neurological:     General: No focal deficit present.     Mental Status: She is alert and oriented to person, place, and time.  Psychiatric:        Mood and Affect: Mood normal.        Behavior: Behavior normal.      UC Treatments / Results  Labs (all labs ordered are listed, but only abnormal results are displayed) Labs Reviewed  NOVEL CORONAVIRUS, NAA (HOSP ORDER, SEND-OUT TO REF LAB; TAT 18-24 HRS)    EKG   Radiology No results found.  Procedures Procedures (including critical care time)  Medications Ordered in UC Medications - No data to display  Initial Impression / Assessment and Plan / UC Course  I have reviewed the triage vital signs and the nursing notes.  Pertinent labs & imaging results that were available during my care of the patient were reviewed by me and considered in my medical decision making (see chart for details).    49 year old female with a 2-day history of headache and fatigue.  She has also had known exposure to COVID-19.  Patient is afebrile.  Nontoxic-appearing.  No high risk factors for COVID-19 complications.  Supportive care and isolation per CDC guidelines recommended. Today's evaluation has revealed no signs of a dangerous process. Discussed diagnosis with patient and/or guardian. Patient and/or guardian aware of their diagnosis, possible red flag symptoms to watch out for and need for close follow up. Patient and/or guardian understands verbal and written discharge instructions. Patient and/or guardian comfortable with plan and disposition.  Patient and/or guardian has a clear mental status at this  time, good insight into illness (after discussion and teaching) and has clear judgment to make decisions regarding their care  This care was provided during an unprecedented National Emergency due to the Novel Coronavirus (COVID-19) pandemic. COVID-19 infections and transmission risks place heavy strains on healthcare resources.  As this pandemic evolves, our facility, providers, and staff strive to respond fluidly, to remain operational, and to provide care relative to  available resources and information. Outcomes are unpredictable and treatments are without well-defined guidelines. Further, the impact of COVID-19 on all aspects of urgent care, including the impact to patients seeking care for reasons other than COVID-19, is unavoidable during this national emergency. At this time of the global pandemic, management of patients has significantly changed, even for non-COVID positive patients given high local and regional COVID volumes at this time requiring high healthcare system and resource utilization. The standard of care for management of both COVID suspected and non-COVID suspected patients continues to change rapidly at the local, regional, national, and global levels. This patient was worked up and treated to the best available but ever changing evidence and resources available at this current time.   Documentation was completed with the aid of voice recognition software. Transcription may contain typographical errors.  Final Clinical Impressions(s) / UC Diagnoses   Final diagnoses:  Close exposure to COVID-19 virus  Encounter for screening laboratory testing for COVID-19 virus     Discharge Instructions     You may take tylenol or ibuprofen as needed for fevers/headache/body aches. Drink plenty of fluids. Stay in home isolation until you receive results of your COVID test. You will only be notified for positive results. You may go online to MyChart in the next few days and review your  results. Please follow CDC guidelines that are attached. You may discontinue home isolation when there has been at least 10 days since symptoms onset AND 3 days fever free without antipyretics (Tylenol or Ibuprofen) AND an overall improvement in your symptoms. Go to the ED immediately if you get worse or have any other symptoms.   Feel better soon!  Aldona Bar, FNP-C      ED Prescriptions    None     PDMP not reviewed this encounter.   Enrique Sack, [place] 11/04/19 1221

## 2019-11-05 LAB — NOVEL CORONAVIRUS, NAA (HOSP ORDER, SEND-OUT TO REF LAB; TAT 18-24 HRS): SARS-CoV-2, NAA: NOT DETECTED

## 2019-11-14 ENCOUNTER — Ambulatory Visit
Admission: EM | Admit: 2019-11-14 | Discharge: 2019-11-14 | Disposition: A | Discharge status: Home / Self Care | Payer: BC Managed Care – PPO

## 2019-11-14 ENCOUNTER — Encounter: Payer: Self-pay | Admitting: Emergency Medicine

## 2019-11-14 ENCOUNTER — Other Ambulatory Visit: Payer: Self-pay

## 2019-11-14 DIAGNOSIS — L02419 Cutaneous abscess of limb, unspecified: Secondary | ICD-10-CM | POA: Diagnosis not present

## 2019-11-14 MED ORDER — SULFAMETHOXAZOLE-TRIMETHOPRIM 800-160 MG PO TABS: 1.0000 | ORAL_TABLET | Freq: Two times a day (BID) | ORAL | 0 refills | Status: AC

## 2019-11-14 MED ORDER — LIDOCAINE HCL (PF) 1 % IJ SOLN
5.0000 mL | Freq: Once | INTRAMUSCULAR | Status: AC
  Administered 2019-11-14: 18:00:00 5 mL

## 2019-11-14 NOTE — Discharge Instructions (Addendum)
It was very nice seeing you today in clinic. Thank you for entrusting me with your care.   Keep area clean and dry. Change dressing once daily. Remove packing, or come by clinic and we will, in 2 days. Take antibiotics as prescribed. Monitor for signs and symptoms of infection, which would include increased redness, swelling, streaking, drainage, pain, and the development of a fever. May use Tylenol and/or Ibuprofen as needed for pain.   Make arrangements to follow up with your regular doctor in 1 week for re-evaluation if not improving. If your symptoms/condition worsens, please seek follow up care either here or in the ER. Please remember, our [facility] providers are "right here with you" when you need [place].   Again, it was my pleasure to take care of you today. Thank you for choosing our clinic. I hope that you start to feel better quickly.   Quentin Mulling, MSN, APRN, FNP-C, CEN Advanced Practice Provider [facility]

## 2019-11-14 NOTE — ED Provider Notes (Signed)
[place], [place]   Name: Sharon Alvarez DOB: 12-05-1970 MRN: [id number] CSN: [id number] PCP: System, Pcp Not In  Arrival date and time:  11/14/19 1650  Chief Complaint:  Abscess   NOTE: Prior to seeing the patient today, I have reviewed the triage nursing documentation and vital signs. Clinical staff has updated patient's PMH/PSHx, current medication list, and drug allergies/intolerances to ensure comprehensive history available to assist in medical decision making.   History:   HPI: Sharon Alvarez is a 49 y.o. female who presents today with complaints of an abscess to her LEFT axilla that she first appreciated approximately 2 days ago.  Patient reports that area has progressively gotten larger.  She has appreciated significant pain, increased erythema, and warmth to the area.  She reports some purulent drainage from the area.  Past medical history significant for "boils" to other areas of her body.  She notes that she has never had an axillary abscess in the past.  Patient denies any associated fevers. Despite her symptoms, patient has not taken any over the counter interventions to help improve/relieve her reported symptoms at home.   Past Medical History:  Diagnosis Date  . Hypertension   . Idiopathic intracranial hypertension     Past Surgical History:  Procedure Laterality Date  . ABDOMINAL HYSTERECTOMY    . JOINT REPLACEMENT    . REPLACEMENT TOTAL KNEE      Family History  Problem Relation Age of Onset  . Breast cancer Mother   . Diabetes Mother        pre-diabetic  . Migraines Father     Social History   Tobacco Use  . Smoking status: Never Smoker  . Smokeless tobacco: Never Used  Substance Use Topics  . Alcohol use: Never  . Drug use: Never    There are no problems to display for this patient.   Home Medications:    Current Meds  Medication Sig  . acetaZOLAMIDE (DIAMOX) 500 MG capsule Take by mouth.  Marland Kitchen atenolol (TENORMIN) 50 MG  tablet Take 1 tablet (50 mg total) by mouth daily.  . metFORMIN (GLUCOPHAGE) 500 MG tablet Take 1 tablet (500 mg total) by mouth 2 (two) times daily with a meal.  . spironolactone (ALDACTONE) 100 MG tablet Take by mouth.    Allergies:   Patient has no known allergies.  Review of Systems (ROS): Review of Systems  Constitutional: Negative for chills and fever.  Respiratory: Negative for cough and shortness of breath.   Cardiovascular: Negative for chest pain and palpitations.  Gastrointestinal: Negative for nausea and vomiting.  Musculoskeletal: Negative for back pain and neck pain.  Skin: Positive for color change. Negative for pallor and rash.       Abscess to LEFT axilla  All other systems reviewed and are negative.    Vital Signs: Today's Vitals   11/14/19 1706 11/14/19 1708 11/14/19 1807  BP: (!) 143/94    Pulse: 64    Resp: 18    Temp: 97.9 F (36.6 C)    TempSrc: Oral    SpO2: 100%    Weight:  248 lb (112.5 kg)   Height:  5\' 6"  (1.676 m)   PainSc:  7  7     Physical Exam: Physical Exam  Constitutional: She is oriented to person, place, and time and well-developed, well-nourished, and in no distress.  HENT:  Head: Normocephalic and atraumatic.  Eyes: Pupils are equal, round, and reactive to light.  Cardiovascular: Normal rate, regular rhythm,  normal heart sounds and intact distal pulses.  Pulmonary/Chest: Effort normal and breath sounds normal.  Musculoskeletal:     Cervical back: Normal range of motion and neck supple.  Lymphadenopathy:    She has no cervical adenopathy.  Neurological: She is alert and oriented to person, place, and time. Gait normal.  Skin: Skin is warm and dry. Lesion noted. No rash noted. She is not diaphoretic.  2.5 x 2.0 cm abscess to LEFT axilla.  Area is exquisitely TTP.  There is surrounding erythema and warmth noted.  No drainage.  See attached medical photograph.  Psychiatric: Memory, affect and judgment normal. Her mood appears  anxious.  Nursing note and vitals reviewed.     Urgent Care Treatments / Results:   Orders Placed This Encounter  Procedures  . ED INCISION AND DRAINAGE  . Apply dressing    LABS: PLEASE NOTE: all labs that were ordered this encounter are listed, however only abnormal results are displayed. Labs Reviewed - No data to display  EKG: -None  RADIOLOGY: No results found.  PROCEDURES: Incision and Drainage Performed by: Verlee Monte, NP Authorized by: Verlee Monte, NP   Consent:    Consent obtained:  Verbal   Consent given by:  Patient   Risks discussed:  Bleeding, incomplete drainage, pain, damage to other organs and infection   Alternatives discussed:  No treatment, alternative treatment and referral Universal protocol:    Procedure explained and questions answered to patient or proxy's satisfaction: yes     Patient identity confirmed:  Verbally with patient Location:    Type:  Abscess   Size:  2.5 x 2.0 cm   Location: LEFT axilla. Pre-procedure details:    Skin preparation:  Betadine Anesthesia (see MAR for exact dosages):    Anesthesia method:  Local infiltration   Local anesthetic:  Lidocaine 1% w/o epi Procedure type:    Complexity:  Complex Procedure details:    Incision types:  Single straight   Incision depth:  Subcutaneous   Scalpel blade:  11   Wound management:  Probed and deloculated, irrigated with saline and extensive cleaning   Drainage:  Purulent and bloody   Drainage amount:  Copious   Wound treatment:  Wound left open   Packing materials:  1/4 in gauze Post-procedure details:    Patient tolerance of procedure:  Tolerated well, no immediate complications    MEDICATIONS RECEIVED THIS VISIT: Medications  lidocaine (PF) (XYLOCAINE) 1 % injection 5 mL (5 mLs Infiltration Given 11/14/19 1807)    PERTINENT CLINICAL COURSE NOTES/UPDATES:   Initial Impression / Assessment and Plan / Urgent Care Course:  Pertinent labs & imaging results that  were available during my care of the patient were personally reviewed by me and considered in my medical decision making (see lab/imaging section of note for values and interpretations).  Sharon Alvarez is a 49 y.o. female who presents to [facility] today with complaints of Abscess   Patient is well appearing overall in clinic today. She does not appear to be in any acute distress. Presenting symptoms (see HPI) and exam as documented above.  Abscess drained as per above procedure note.  Packing placed.  Patient to leave packing in place x 2 days.  She reports that she feels comfortable removing packing at home.  Discussed with patient that this is acceptable, however if she has any concerns or feels as if further assessment is required, she is more than welcome to return to the clinic  for a wound check in 2 days.  Wound cleansed and dressed by clinic nursing staff.  Wound care following packing removal discussed, included use of warm compresses to help with inflammation and to promote continued drainage. Will cover for infection with a 7-day course of SMZ-TMP DS. Patient to monitor for signs and symptoms of infection, which would include increased redness, swelling, streaking, drainage, pain, and the development of a fever. May use Tylenol and/or Ibuprofen as needed for pain/fever.   Discussed follow up with primary care physician in 1 week for re-evaluation. I have reviewed the follow up and strict return precautions for any new or worsening symptoms. Patient is aware of symptoms that would be deemed urgent/emergent, and would thus require further evaluation either here or in the emergency department. At the time of discharge, she verbalized understanding and consent with the discharge plan as it was reviewed with her. All questions were fielded by provider and/or clinic staff prior to patient discharge.    Final Clinical Impressions / Urgent Care Diagnoses:   Final diagnoses:    Axillary abscess    New Prescriptions:  [place] Controlled Substance Registry consulted? Not Applicable  Meds ordered this encounter  Medications  . lidocaine (PF) (XYLOCAINE) 1 % injection 5 mL  . sulfamethoxazole-trimethoprim (BACTRIM DS) 800-160 MG tablet    Sig: Take 1 tablet by mouth 2 (two) times daily for 7 days.    Dispense:  14 tablet    Refill:  0    Recommended Follow up Care:  Patient encouraged to follow up with the following provider within the specified time frame, or sooner as dictated by the severity of her symptoms. As always, she was instructed that for any urgent/emergent care needs, she should seek care either here or in the emergency department for more immediate evaluation.  Follow-up Information    PCP In 1 week.   Why: General reassessment of symptoms if not improving        NOTE: This note was prepared using Lobbyist along with smaller Company secretary. Despite my best ability to proofread, there is the potential that transcriptional errors may still occur from this process, and are completely unintentional.     Karen Kitchens, NP 11/16/19 1225

## 2019-11-14 NOTE — ED Triage Notes (Signed)
Patient in today c/o cyst under her left arm that she noticed 2 days ago. Patient states it started out small and she thought it was a hair follicle, but got larger and more painful yesterday and today it started draining.

## 2020-03-18 ENCOUNTER — Ambulatory Visit
Admission: EM | Admit: 2020-03-18 | Discharge: 2020-03-18 | Disposition: A | Discharge status: Home / Self Care | Payer: BC Managed Care – PPO | Attending: Family Medicine | Admitting: Family Medicine

## 2020-03-18 ENCOUNTER — Other Ambulatory Visit: Payer: Self-pay

## 2020-03-18 ENCOUNTER — Encounter: Payer: Self-pay | Admitting: Emergency Medicine

## 2020-03-18 DIAGNOSIS — M722 Plantar fascial fibromatosis: Secondary | ICD-10-CM

## 2020-03-18 HISTORY — DX: Type 2 diabetes mellitus without complications: E11.9

## 2020-03-18 MED ORDER — MELOXICAM 15 MG PO TABS: 15.0000 mg | ORAL_TABLET | Freq: Every day | ORAL | 0 refills | Status: AC | PRN

## 2020-03-18 NOTE — ED Triage Notes (Signed)
Patient states she is having pain in the bottom of both feet. She states her doctor seems to think she has plantar fascitis and offered to do a cortisone injection but she decided to wait and see if the pain resolved. She states the pain has continued and she cannot get any relief.

## 2020-03-18 NOTE — Discharge Instructions (Signed)
Rest.   Ice.  Heel cups.  Medication as directed.  Follow up with PCP.  Take care  Dr. Adriana Simas

## 2020-03-18 NOTE — ED Provider Notes (Signed)
[facility]    CSN: [id number] Arrival date & time: 03/18/20  1115      History   Chief Complaint Chief Complaint  Patient presents with  . Foot Pain   HPI  49 year old female presents with bilateral heel pain, left greater than right.  Patient reports that this has been going on for months.  She states that it has recently worsened over the past few weeks.  She saw her primary care physician and was told that this was secondary to plantar fasciitis.  She was offered an injection but declined at the time.  She states that she has tried resting, ice, tramadol, ibuprofen without resolution.  Worse first thing in the morning and also worse at the end of the day.  No relieving factors.  No recent fall, trauma, injury.  No other complaints.  Past Medical History:  Diagnosis Date  . Diabetes mellitus without complication (HCC)   . Hypertension   . Idiopathic intracranial hypertension    Past Surgical History:  Procedure Laterality Date  . ABDOMINAL HYSTERECTOMY    . JOINT REPLACEMENT    . REPLACEMENT TOTAL KNEE     OB History   No obstetric history on file.    Home Medications    Prior to Admission medications   Medication Sig Start Date End Date Taking? Authorizing Provider  acetaZOLAMIDE (DIAMOX) 500 MG capsule Take by mouth.   Yes [provider]  amLODipine (NORVASC) 10 MG tablet Take 10 mg by mouth daily. 11/04/19  Yes [provider]  atenolol (TENORMIN) 50 MG tablet Take 1 tablet (50 mg total) by mouth daily. 01/09/19 03/18/20 Yes Arnaldo Natal, MD  metFORMIN (GLUCOPHAGE) 500 MG tablet Take 1 tablet (500 mg total) by mouth 2 (two) times daily with a meal. 06/22/19 03/18/20 Yes Darci Current, MD  Semaglutide (RYBELSUS PO) Take by mouth.   Yes [provider]  spironolactone (ALDACTONE) 100 MG tablet Take by mouth. 05/27/16  Yes [provider]  meloxicam (MOBIC) 15 MG tablet Take 1 tablet (15 mg total) by mouth daily as  needed for pain. 03/18/20   Tommie Sams, DO    Family History Family History  Problem Relation Age of Onset  . Breast cancer Mother   . Diabetes Mother        pre-diabetic  . Migraines Father     Social History Social History   Tobacco Use  . Smoking status: Never Smoker  . Smokeless tobacco: Never Used  Substance Use Topics  . Alcohol use: Never  . Drug use: Never     Allergies   Patient has no known allergies.   Review of Systems Review of Systems  Musculoskeletal:       Foot pain/heel pain.   Physical Exam Triage Vital Signs ED Triage Vitals  Enc Vitals Group     BP 03/18/20 1134 130/90     Pulse --      Resp 03/18/20 1132 18     Temp 03/18/20 1132 98.4 F (36.9 C)     Temp Source 03/18/20 1132 Oral     SpO2 --      Weight 03/18/20 1127 250 lb (113.4 kg)     Height 03/18/20 1127 5\' 6"  (1.676 m)     Head Circumference --      Peak Flow --      Pain Score 03/18/20 1127 10     Pain Loc --      Pain Edu? --  Excl. in [place]? --    Updated Vital Signs BP 130/90 (BP Location: Right Arm)   Temp 98.4 F (36.9 C) (Oral)   Resp 18   Ht 5\' 6"  (1.676 m)   Wt 113.4 kg   BMI 40.35 kg/m   Visual Acuity Right Eye Distance:   Left Eye Distance:   Bilateral Distance:    Right Eye Near:   Left Eye Near:    Bilateral Near:     Physical Exam Vitals and nursing note reviewed.  Constitutional:      General: She is not in acute distress.    Appearance: Normal appearance. She is not ill-appearing.  HENT:     Head: Normocephalic and atraumatic.  Eyes:     General:        Right eye: No discharge.        Left eye: No discharge.     Conjunctiva/sclera: Conjunctivae normal.  Pulmonary:     Effort: Pulmonary effort is normal. No respiratory distress.  Musculoskeletal:       Feet:  Feet:     Comments: Exquisite tenderness over the left locations at the attachment site of the plantar fascia (most notably the left foot). Neurological:     Mental Status:  She is alert.  Psychiatric:        Mood and Affect: Mood normal.        Behavior: Behavior normal.    UC Treatments / Results  Labs (all labs ordered are listed, but only abnormal results are displayed) Labs Reviewed - No data to display  EKG   Radiology No results found.  Procedures Procedures (including critical care time) Injection - Plantar fasciitis Left foot Medication: 40 mg (1 mL) Solumedrol  3 mL Lidocaine 1% without epi Preparation: area cleansed with alcohol x 3  Injection: [place] identified.  Medial heel, plantar aspect at the attachment site of the plantar fascia.  Injected directly without difficulty. Patient tolerated well without bleeding or paresthesias    Medications Ordered in UC Medications - No data to display  Initial Impression / Assessment and Plan / UC Course  I have reviewed the triage vital signs and the nursing notes.  Pertinent labs & imaging results that were available during my care of the patient were reviewed by me and considered in my medical decision making (see chart for details).    49 year old female presents with plantar fasciitis. Injection performed today.  Advised rest, ice, heel cups, meloxicam as directed.  Follow-up with PCP.  May need referral to podiatry.  Final Clinical Impressions(s) / UC Diagnoses   Final diagnoses:  Plantar fasciitis of left foot  Plantar fasciitis of right foot     Discharge Instructions     Rest.   Ice.  Heel cups.  Medication as directed.  Follow up with PCP.  Take care  Dr. Lacinda Axon    ED Prescriptions    Medication Sig Dispense Auth. Provider   meloxicam (MOBIC) 15 MG tablet Take 1 tablet (15 mg total) by mouth daily as needed for pain. 30 tablet Coral Spikes, DO     PDMP not reviewed this encounter.   Coral Spikes, [place] 03/18/20 1305

## 2020-05-15 ENCOUNTER — Other Ambulatory Visit: Payer: Self-pay

## 2020-05-15 ENCOUNTER — Ambulatory Visit
Admission: RE | Admit: 2020-05-15 | Discharge: 2020-05-15 | Disposition: A | Discharge status: Home / Self Care | Payer: BC Managed Care – PPO | Source: Ambulatory Visit | Attending: Family Medicine | Admitting: Family Medicine

## 2020-05-15 VITALS — BP 139/83 | HR 72 | Temp 98.4°F | Resp 14 | Ht 66.0 in | Wt 250.0 lb

## 2020-05-15 DIAGNOSIS — G43009 Migraine without aura, not intractable, without status migrainosus: Secondary | ICD-10-CM

## 2020-05-15 MED ORDER — ONDANSETRON 8 MG PO TBDP: 8.0000 mg | ORAL_TABLET | Freq: Three times a day (TID) | ORAL | 0 refills | Status: AC | PRN

## 2020-05-15 MED ORDER — RIZATRIPTAN BENZOATE 10 MG PO TBDP: 10.0000 mg | ORAL_TABLET | ORAL | 0 refills | Status: AC | PRN

## 2020-05-15 MED ORDER — HYDROCODONE-ACETAMINOPHEN 5-325 MG PO TABS: ORAL_TABLET | ORAL | 0 refills | Status: AC

## 2020-05-15 NOTE — ED Triage Notes (Signed)
Patient c/o migraine headache since Sunday.  Patient states that she has tried Ibuprofen with no relief.  Patient denies N/V.

## 2020-05-15 NOTE — ED Provider Notes (Signed)
[facility]    CSN: [id number] Arrival date & time: 05/15/20  1712      History   Chief Complaint Chief Complaint  Patient presents with  . Appointment  . Headache    HPI Sharon Alvarez is a 49 y.o. female.   49 yo female with a h/o chronic, intermittent migraines presents with a c/o migraine this week not relieved with otc medications. Migraine headache is similar to prior migraine episodes and associated with nausea. Denies any vomiting, fevers, chills, numbness/tingling. States in the past she had taken maxalt with good relief but did not have any recently because she's been out.    Headache   Past Medical History:  Diagnosis Date  . Diabetes mellitus without complication (HCC)   . Hypertension   . Idiopathic intracranial hypertension     There are no problems to display for this patient.   Past Surgical History:  Procedure Laterality Date  . ABDOMINAL HYSTERECTOMY    . JOINT REPLACEMENT    . REPLACEMENT TOTAL KNEE      OB History   No obstetric history on file.      Home Medications    Prior to Admission medications   Medication Sig Start Date End Date Taking? Authorizing Provider  amLODipine (NORVASC) 10 MG tablet Take 10 mg by mouth daily. 11/04/19  Yes [provider]  atenolol (TENORMIN) 50 MG tablet Take 1 tablet (50 mg total) by mouth daily. 01/09/19 05/15/20 Yes Arnaldo Natal, MD  metFORMIN (GLUCOPHAGE) 500 MG tablet Take 1 tablet (500 mg total) by mouth 2 (two) times daily with a meal. 06/22/19 05/15/20 Yes Darci Current, MD  Semaglutide (RYBELSUS PO) Take by mouth.   Yes [provider]  spironolactone (ALDACTONE) 100 MG tablet Take by mouth. 05/27/16  Yes [provider]  acetaZOLAMIDE (DIAMOX) 500 MG capsule Take by mouth.    [provider]  HYDROcodone-acetaminophen (NORCO/VICODIN) 5-325 MG tablet 1-2 tabs po qd prn 05/15/20   Payton Mccallum, MD  meloxicam (MOBIC) 15 MG tablet Take 1  tablet (15 mg total) by mouth daily as needed for pain. 03/18/20   Tommie Sams, DO  ondansetron (ZOFRAN ODT) 8 MG disintegrating tablet Take 1 tablet (8 mg total) by mouth every 8 (eight) hours as needed. 05/15/20   Payton Mccallum, MD  rizatriptan (MAXALT-MLT) 10 MG disintegrating tablet Take 1 tablet (10 mg total) by mouth as needed for migraine. May repeat in 2 hours if needed 05/15/20   Payton Mccallum, MD    Family History Family History  Problem Relation Age of Onset  . Breast cancer Mother   . Diabetes Mother        pre-diabetic  . Migraines Father     Social History Social History   Tobacco Use  . Smoking status: Never Smoker  . Smokeless tobacco: Never Used  Vaping Use  . Vaping Use: Never used  Substance Use Topics  . Alcohol use: Never  . Drug use: Never     Allergies   Patient has no known allergies.   Review of Systems Review of Systems  Neurological: Positive for headaches.     Physical Exam Triage Vital Signs ED Triage Vitals  Enc Vitals Group     BP 05/15/20 1739 139/83     Pulse Rate 05/15/20 1739 72     Resp 05/15/20 1739 14     Temp 05/15/20 1739 98.4 F (36.9 C)     Temp Source 05/15/20 1739  Oral     SpO2 05/15/20 1739 98 %     Weight 05/15/20 1736 250 lb (113.4 kg)     Height 05/15/20 1736 5\' 6"  (1.676 m)     Head Circumference --      Peak Flow --      Pain Score 05/15/20 1736 9     Pain Loc --      Pain Edu? --      Excl. in [place]? --    No data found.  Updated Vital Signs BP 139/83 (BP Location: Right Arm)   Pulse 72   Temp 98.4 F (36.9 C) (Oral)   Resp 14   Ht 5\' 6"  (1.676 m)   Wt 113.4 kg   SpO2 98%   BMI 40.35 kg/m   Visual Acuity Right Eye Distance:   Left Eye Distance:   Bilateral Distance:    Right Eye Near:   Left Eye Near:    Bilateral Near:     Physical Exam Vitals and nursing note reviewed.  Constitutional:      General: She is not in acute distress.    Appearance: She is not toxic-appearing or  diaphoretic.  Eyes:     Extraocular Movements: Extraocular movements intact.     Pupils: Pupils are equal, round, and reactive to light.  Musculoskeletal:     Cervical back: Neck supple.  Neurological:     General: No focal deficit present.     Mental Status: She is alert and oriented to person, place, and time.     Cranial Nerves: No cranial nerve deficit.     Sensory: No sensory deficit.     Motor: No weakness.     Coordination: Coordination normal.     Gait: Gait normal.     Deep Tendon Reflexes: Reflexes normal.      UC Treatments / Results  Labs (all labs ordered are listed, but only abnormal results are displayed) Labs Reviewed - No data to display  EKG   Radiology No results found.  Procedures Procedures (including critical care time)  Medications Ordered in UC Medications - No data to display  Initial Impression / Assessment and Plan / UC Course  I have reviewed the triage vital signs and the nursing notes.  Pertinent labs & imaging results that were available during my care of the patient were reviewed by me and considered in my medical decision making (see chart for details).      Final Clinical Impressions(s) / UC Diagnoses   Final diagnoses:  Migraine without aura and without status migrainosus, not intractable    ED Prescriptions    Medication Sig Dispense Auth. Provider   rizatriptan (MAXALT-MLT) 10 MG disintegrating tablet Take 1 tablet (10 mg total) by mouth as needed for migraine. May repeat in 2 hours if needed 10 tablet 05/17/20, MD   HYDROcodone-acetaminophen (NORCO/VICODIN) 5-325 MG tablet 1-2 tabs po qd prn 6 tablet , MD   ondansetron (ZOFRAN ODT) 8 MG disintegrating tablet Take 1 tablet (8 mg total) by mouth every 8 (eight) hours as needed. 6 tablet Payton Mccallum, MD      1.diagnosis reviewed with patient 2. rx as per orders above; reviewed possible side effects, interactions, risks and benefits  3. Follow-up prn  if symptoms worsen or don't improve   I have reviewed the PDMP during this encounter.   Payton Mccallum, MD 05/15/20 1827

## 2020-07-13 ENCOUNTER — Other Ambulatory Visit: Payer: BC Managed Care – PPO

## 2020-07-13 ENCOUNTER — Ambulatory Visit
Admission: EM | Admit: 2020-07-13 | Discharge: 2020-07-13 | Disposition: A | Discharge status: Home / Self Care | Payer: BC Managed Care – PPO

## 2020-09-17 IMAGING — CT CT ABDOMEN AND PELVIS WITH CONTRAST
2 of 5 series · 16 of 46 positions shown, 18 images · IV contrast (APPLIED)
Comparison: None.

CLINICAL DATA: Bilateral lower abdominal pain

EXAM:
CT ABDOMEN AND PELVIS WITH CONTRAST
TECHNIQUE: Multidetector CT imaging of the abdomen and pelvis was performed
using the standard protocol following bolus administration of
intravenous contrast.
CONTRAST:  100mL OMNIPAQUE IOHEXOL 300 MG/ML  SOLN

[Series 2: routine abd/pel with · axial · 0.98mm/px · z∈[-851,-401]mm · 13 of 102 slices shown, 15 images]
[im 6/102  soft-tissue]
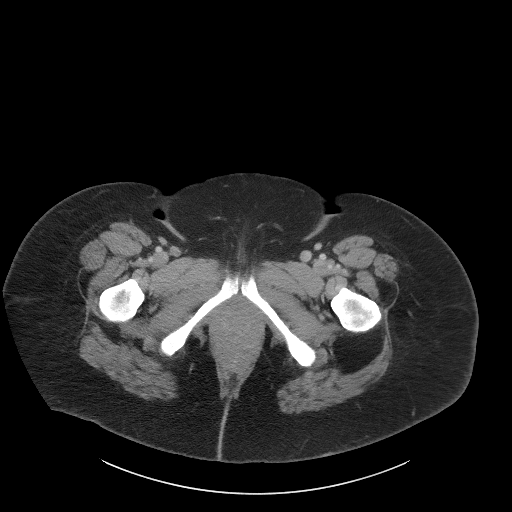
[im 6/102  bone]
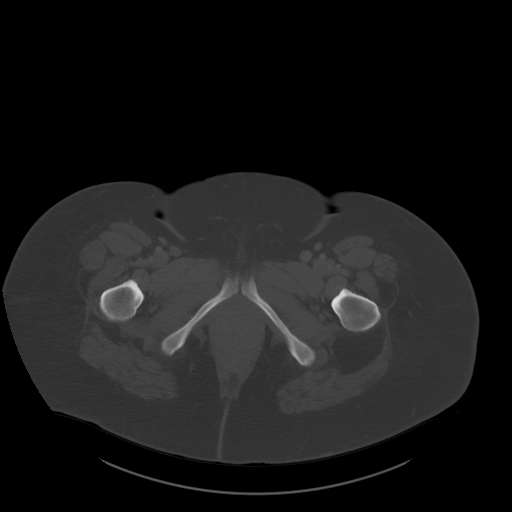
[im 16/102  soft-tissue]
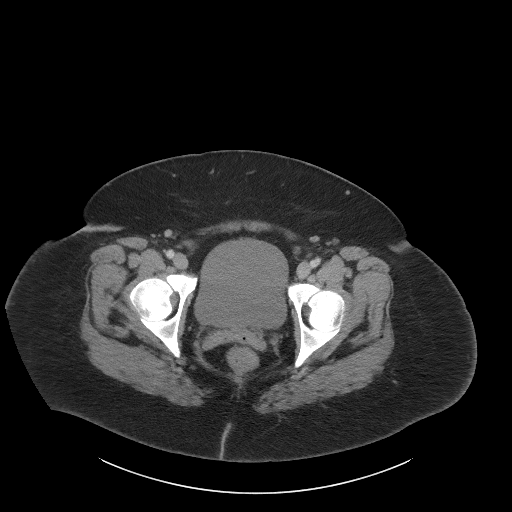
[im 21/102  soft-tissue]
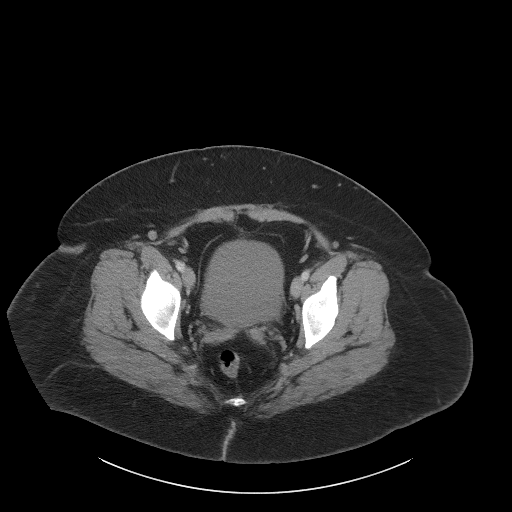
[im 31/102  soft-tissue]
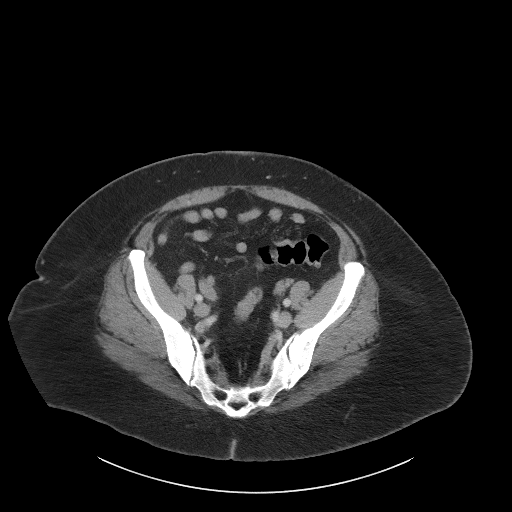
[im 36/102  soft-tissue]
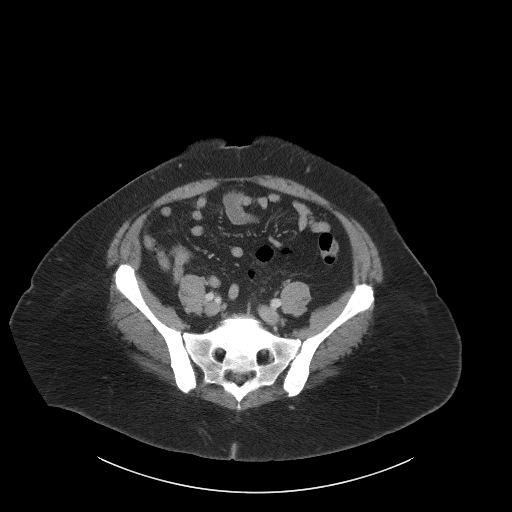
[im 46/102  soft-tissue]
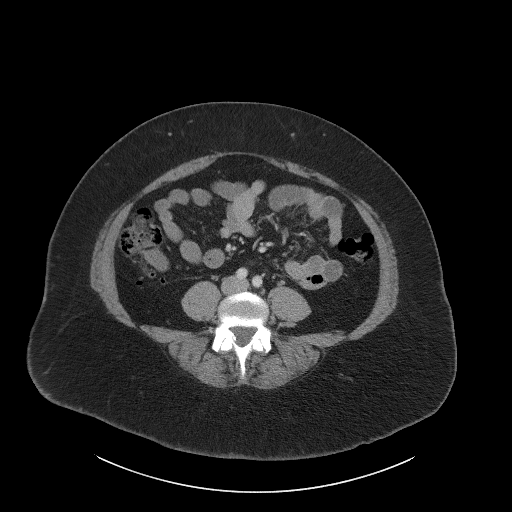
[im 51/102  soft-tissue]
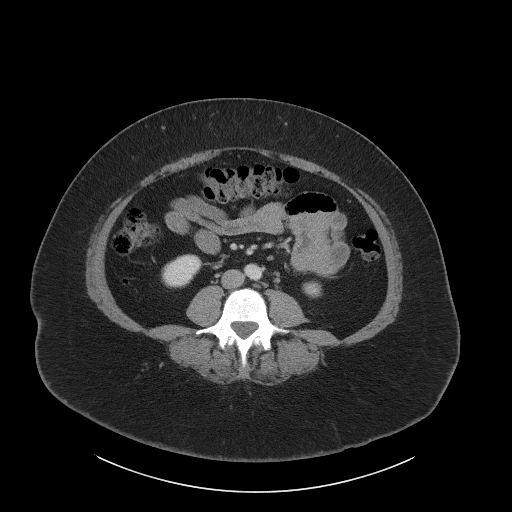
[im 56/102  soft-tissue]
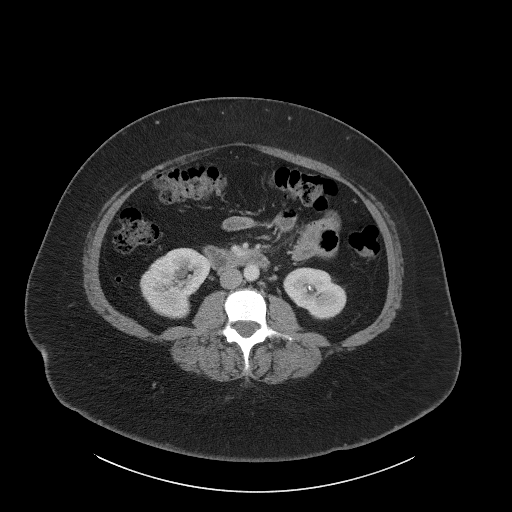
[im 66/102  soft-tissue]
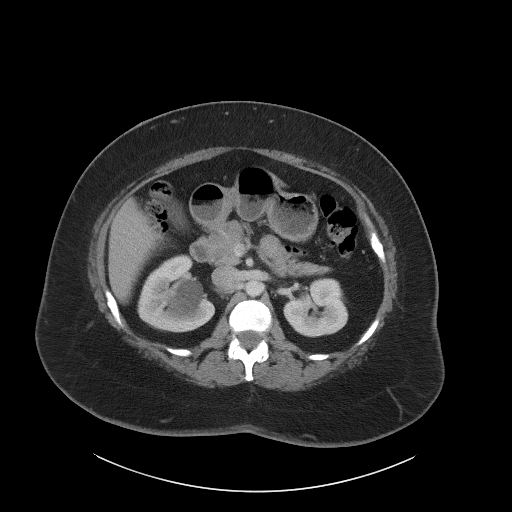
[im 66/102  bone]
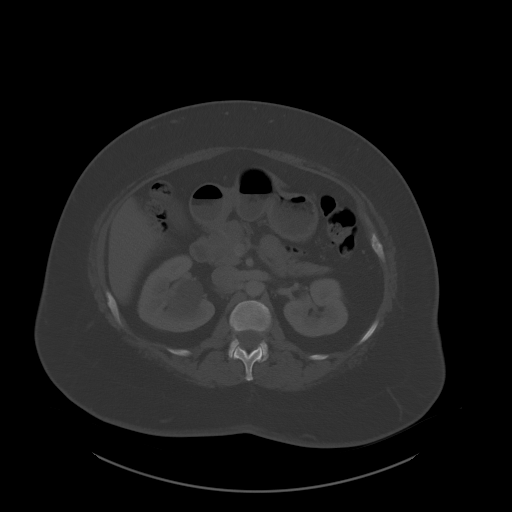
[im 71/102  soft-tissue]
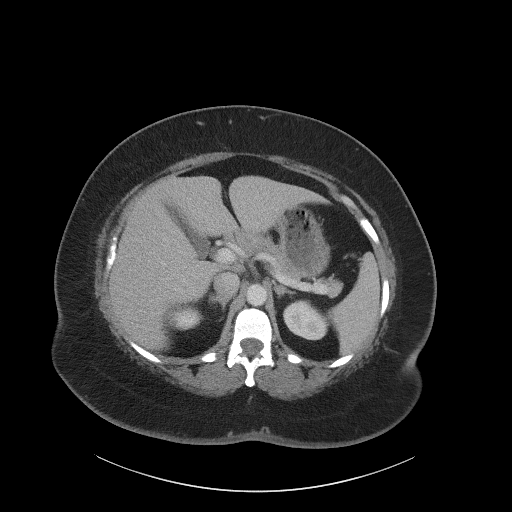
[im 81/102  soft-tissue]
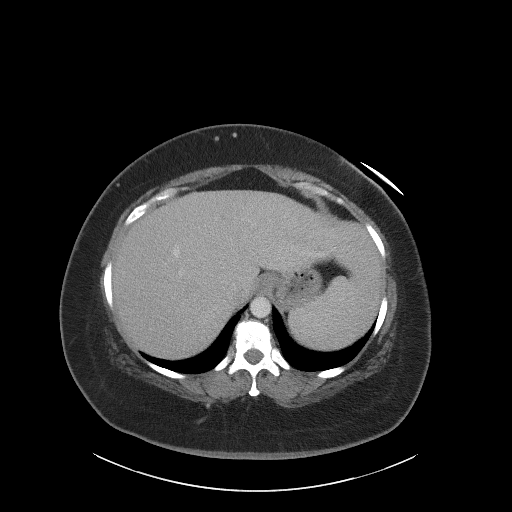
[im 86/102  soft-tissue]
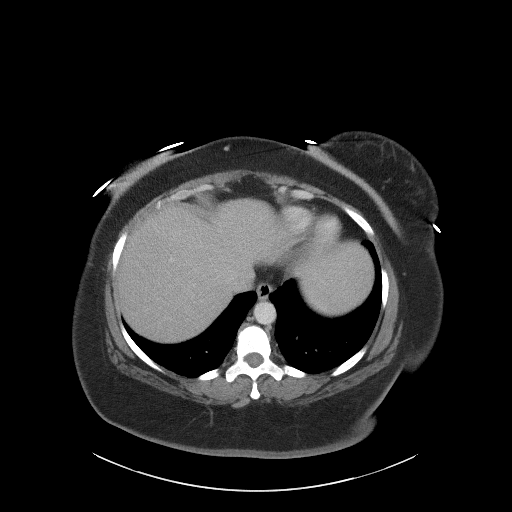
[im 96/102  soft-tissue]
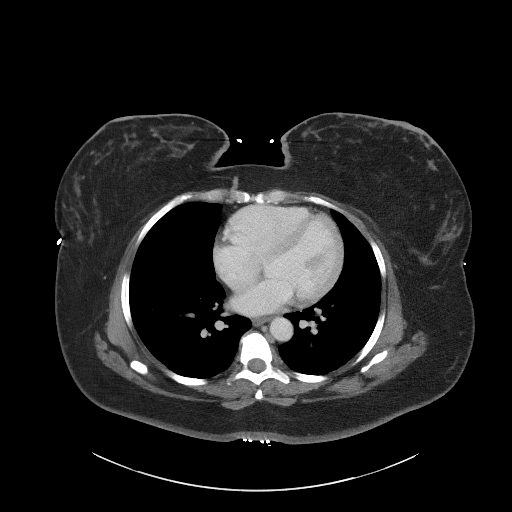

[Series 5: coronal st · coronal · 0.91mm/px · 3 of 108 slices shown]
[im 36/108  soft-tissue]
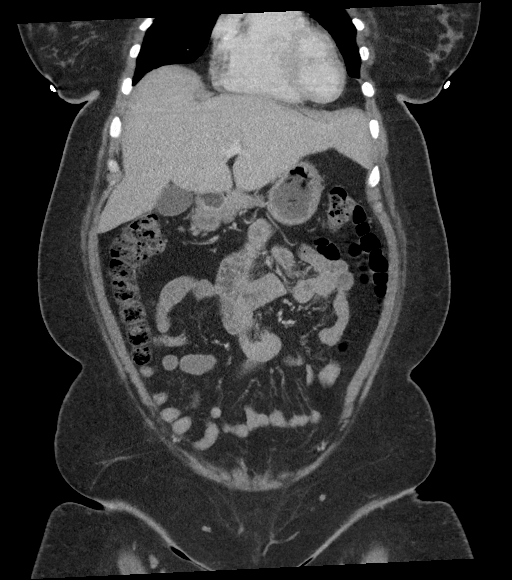
[im 48/108  soft-tissue]
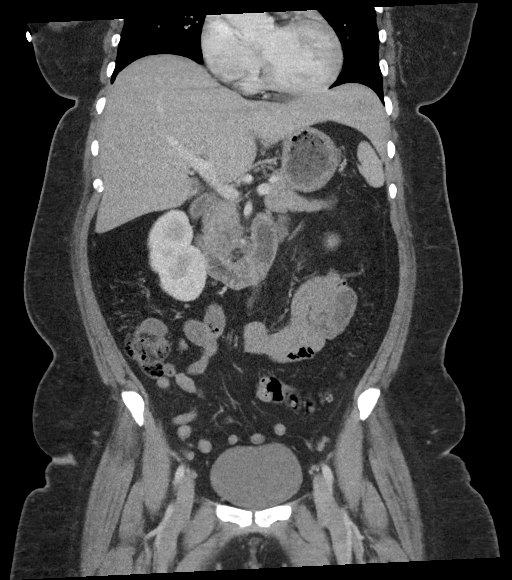
[im 60/108  soft-tissue]
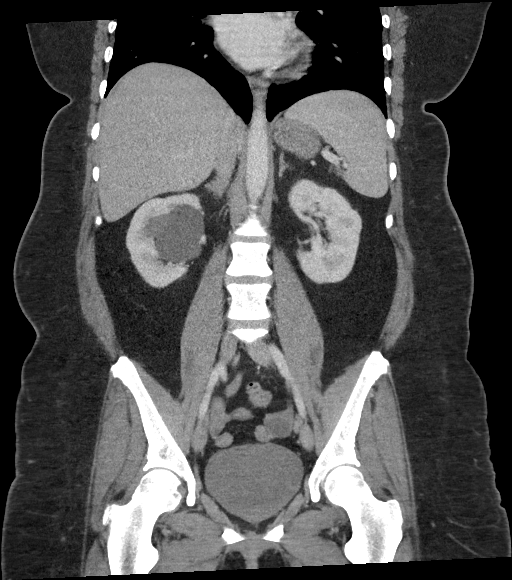

[16 of 46 positions shown; findings below may reference images not displayed]

FINDINGS: Lower chest: The lung bases are clear. The heart size is normal.
There is a left breast nodule measuring 0.9 cm.

Hepatobiliary: The liver is normal. Normal gallbladder.There is no
biliary ductal dilation.

Pancreas: Normal contours without ductal dilatation. No
peripancreatic fluid collection.

Spleen: No splenic laceration or hematoma.

Adrenals/Urinary Tract:

--Adrenal glands: No adrenal hemorrhage.

--Right kidney/ureter: There is a large pelvic cyst measuring 4.2 cm
in the right kidney. There is no evidence for right-sided
hydronephrosis.

--Left kidney/ureter: There is a nonobstructing 2-3 mm stone in the
lower pole the left kidney.

--Urinary bladder: Unremarkable.

Stomach/Bowel:

--Stomach/Duodenum: No hiatal hernia or other gastric abnormality.
Normal duodenal course and caliber.

--Small bowel: No dilatation or inflammation.

--Colon: No focal abnormality.

--Appendix: Normal.

Vascular/Lymphatic: Normal course and caliber of the major abdominal
vessels.

--No retroperitoneal lymphadenopathy.

--No mesenteric lymphadenopathy.

--No pelvic or inguinal lymphadenopathy.

Reproductive: The patient is status post hysterectomy. There is a
left ovarian cystic structure measuring approximately 2.5 cm.

Other: No ascites or free air. The abdominal wall is normal.

Musculoskeletal. No acute displaced fractures.
IMPRESSION: 1. No acute abnormality.
2. There is a 0.9 cm left breast nodule that is not well
characterized on this exam. Follow-up with outpatient mammography is
recommended.
3. Nonobstructing left-sided nephrolith.

## 2021-03-18 ENCOUNTER — Ambulatory Visit: Payer: Self-pay

## 2021-11-09 ENCOUNTER — Ambulatory Visit: Payer: Self-pay
